# Patient Record
Sex: Female | Born: 1970 | Race: Black or African American | Hispanic: No | Marital: Single | Smoking: Former smoker
Health system: Southern US, Community
[De-identification: ages and names within clinical notes are randomized; demographics above are authoritative.]

## PROBLEM LIST (undated history)

## (undated) HISTORY — PX: APPENDECTOMY: SHX54

---

## 2004-12-25 ENCOUNTER — Emergency Department: Payer: Self-pay | Admitting: Emergency Medicine

## 2006-06-01 ENCOUNTER — Emergency Department: Payer: Self-pay | Admitting: Emergency Medicine

## 2007-01-30 ENCOUNTER — Emergency Department: Payer: Self-pay | Admitting: Emergency Medicine

## 2010-07-15 ENCOUNTER — Ambulatory Visit: Payer: Self-pay | Admitting: General Practice

## 2012-07-12 ENCOUNTER — Ambulatory Visit: Payer: Self-pay

## 2013-02-09 ENCOUNTER — Emergency Department: Payer: Self-pay | Admitting: Emergency Medicine

## 2014-06-17 ENCOUNTER — Emergency Department: Payer: Self-pay | Admitting: Student

## 2014-07-19 ENCOUNTER — Encounter: Payer: Self-pay | Admitting: Family Medicine

## 2014-07-22 ENCOUNTER — Encounter: Payer: Self-pay | Admitting: Family Medicine

## 2014-08-21 ENCOUNTER — Encounter: Payer: Self-pay | Admitting: Family Medicine

## 2014-09-21 ENCOUNTER — Encounter: Payer: Self-pay | Admitting: Family Medicine

## 2014-10-22 ENCOUNTER — Encounter: Payer: Self-pay | Admitting: Family Medicine

## 2016-04-06 ENCOUNTER — Ambulatory Visit: Payer: Self-pay | Attending: Internal Medicine

## 2016-07-15 ENCOUNTER — Ambulatory Visit: Payer: Self-pay | Attending: Oncology

## 2019-04-24 ENCOUNTER — Ambulatory Visit: Payer: Self-pay

## 2019-04-26 ENCOUNTER — Ambulatory Visit: Payer: Self-pay

## 2019-04-26 ENCOUNTER — Other Ambulatory Visit: Payer: Self-pay

## 2019-04-26 DIAGNOSIS — Z113 Encounter for screening for infections with a predominantly sexual mode of transmission: Secondary | ICD-10-CM

## 2019-04-26 DIAGNOSIS — A599 Trichomoniasis, unspecified: Secondary | ICD-10-CM

## 2019-04-26 MED ORDER — METRONIDAZOLE 500 MG PO TABS: 2000.0000 mg | ORAL_TABLET | Freq: Once | ORAL | 0 refills | Status: AC

## 2019-04-26 NOTE — Progress Notes (Signed)
    STI clinic/screening visit  Subjective:  Katherine Mercado is a 48 y.o. female being seen today for an STI screening visit. The patient reports they do have symptoms.  Patient has the following medical conditions:  There are no active problems to display for this patient.    Chief Complaint  Patient presents with  . SEXUALLY TRANSMITTED DISEASE    HPI  Patient reports  disch x 2 wks.  Discharge began as light yellow, small amount and over time disch has increased and now has an odor.  See flowsheet for further details and programmatic requirements.    The following portions of the patient's history were reviewed and updated as appropriate: allergies, current medications, past medical history, past social history, past surgical history and problem list.  Objective:  There were no vitals filed for this visit.  Physical Exam Constitutional:      Appearance: She is obese.  HENT:     Mouth/Throat:     Pharynx: Oropharynx is clear. No oropharyngeal exudate.  Neck:     Musculoskeletal: Neck supple. No muscular tenderness.  Abdominal:     Palpations: Abdomen is soft.  Genitourinary:    General: Normal vulva.     Vagina: Vaginal discharge present.     Comments: Mod amt of yellow disch, ph >4.5, + odor no adenopathy Lymphadenopathy:     Cervical: No cervical adenopathy.  Skin:    General: Skin is dry.     Findings: No lesion or rash.  Neurological:     Mental Status: She is alert.    Assessment and Plan:  Katherine Mercado is a 48 y.o. female presenting to the [place] for STI screening  1. Screening examination for venereal disease - WET PREP FOR [place], YEAST, CLUE - Chlamydia/Gonorrhea [place] - HIV [facility] - Syphilis Serology, [facility]  2. Trichimoniasis Metronidazole 2gm po in a single dose. Co. That partner needs treatment Co. To use condoms until partner is treated and always for STD prevention.     No follow-ups on  file.  No future appointments.  Hassell Done, FNP

## 2019-04-26 NOTE — Progress Notes (Signed)
Here today for STD screening. Accepts bloodwork. Hal Morales, RN  Wet Prep results reviewed. Patient treated for Trich per standing orders. Hal Morales, RN

## 2019-04-27 LAB — WET PREP FOR TRICH, YEAST, CLUE
Trichomonas Exam: POSITIVE — AB
Yeast Exam: NEGATIVE

## 2020-05-14 ENCOUNTER — Other Ambulatory Visit: Payer: Self-pay

## 2020-05-14 ENCOUNTER — Ambulatory Visit
Admission: RE | Admit: 2020-05-14 | Discharge: 2020-05-14 | Disposition: A | Discharge status: Home / Self Care | Payer: PRIVATE HEALTH INSURANCE | Source: Ambulatory Visit | Attending: Urgent Care | Admitting: Urgent Care

## 2020-05-14 VITALS — BP 158/97 | HR 75 | Temp 98.4°F | Resp 18

## 2020-05-14 DIAGNOSIS — R350 Frequency of micturition: Secondary | ICD-10-CM | POA: Diagnosis present

## 2020-05-14 DIAGNOSIS — N3001 Acute cystitis with hematuria: Secondary | ICD-10-CM | POA: Diagnosis not present

## 2020-05-14 DIAGNOSIS — R300 Dysuria: Secondary | ICD-10-CM | POA: Insufficient documentation

## 2020-05-14 LAB — POCT URINALYSIS DIP (MANUAL ENTRY)
Bilirubin, UA: NEGATIVE
Glucose, UA: NEGATIVE mg/dL
Ketones, POC UA: NEGATIVE mg/dL
Nitrite, UA: NEGATIVE
Protein Ur, POC: 100 mg/dL — AB
Spec Grav, UA: 1.025 (ref 1.010–1.025)
Urobilinogen, UA: 0.2 E.U./dL
pH, UA: 6 (ref 5.0–8.0)

## 2020-05-14 MED ORDER — CEPHALEXIN 500 MG PO CAPS: 500.0000 mg | ORAL_CAPSULE | Freq: Two times a day (BID) | ORAL | 0 refills | Status: DC

## 2020-05-14 NOTE — ED Provider Notes (Signed)
Katherine Mercado   MRN: [id number] DOB: April 17, 1971  Subjective:   Katherine Mercado is a 49 y.o. female presenting for several day history of urinary frequency, dysuria, cloudy malodorous urine.  Denies fever, nausea, vomiting, belly or pelvic pain, flank pain.  Patient admits that she does not drink water very well at all, likes to drink Pepsi regularly.  No current facility-administered medications for this encounter. No current outpatient medications on file.   No Known Allergies  History reviewed. No pertinent past medical history.   Past Surgical History:  Procedure Laterality Date  . APPENDECTOMY      No family history on file.  Social History   Tobacco Use  . Smoking status: Former Smoker    Quit date: 04/25/2000    Years since quitting: 20.0  . Smokeless tobacco: Never Used  Vaping Use  . Vaping Use: Never used  Substance Use Topics  . Alcohol use: Yes    Comment: socialy  . Drug use: Never    ROS   Objective:   Vitals: BP (!) 158/97 (BP Location: Right Arm)   Pulse 75   Temp 98.4 F (36.9 C) (Oral)   Resp 18   SpO2 100%   Physical Exam Constitutional:      General: She is not in acute distress.    Appearance: Normal appearance. She is well-developed. She is not ill-appearing, toxic-appearing or diaphoretic.  HENT:     Head: Normocephalic and atraumatic.     Nose: Nose normal.     Mouth/Throat:     Mouth: Mucous membranes are moist.     Pharynx: Oropharynx is clear.  Eyes:     General: No scleral icterus.       Right eye: No discharge.        Left eye: No discharge.     Extraocular Movements: Extraocular movements intact.     Conjunctiva/sclera: Conjunctivae normal.     Pupils: Pupils are equal, round, and reactive to light.  Cardiovascular:     Rate and Rhythm: Normal rate.  Pulmonary:     Effort: Pulmonary effort is normal.  Abdominal:     General: Bowel sounds are normal. There is no distension.     Palpations: Abdomen is soft.  There is no mass.     Tenderness: There is no abdominal tenderness. There is no right CVA tenderness, left CVA tenderness, guarding or rebound.  Skin:    General: Skin is warm and dry.  Neurological:     General: No focal deficit present.     Mental Status: She is alert and oriented to person, place, and time.  Psychiatric:        Mood and Affect: Mood normal.        Behavior: Behavior normal.        Thought Content: Thought content normal.        Judgment: Judgment normal.     Results for orders placed or performed during the hospital encounter of 05/14/20 (from the past 24 hour(s))  POCT urinalysis dipstick     Status: Abnormal   Collection Time: 05/14/20  2:37 PM  Result Value Ref Range   Color, UA straw (A) yellow   Clarity, UA cloudy (A) clear   Glucose, UA negative negative mg/dL   Bilirubin, UA negative negative   Ketones, POC UA negative negative mg/dL   Spec Grav, UA 6.010 9.323 - 1.025   Blood, UA moderate (A) negative   pH, UA 6.0 5.0 - 8.0  Protein Ur, POC =100 (A) negative mg/dL   Urobilinogen, UA 0.2 0.2 or 1.0 E.U./dL   Nitrite, UA Negative Negative   Leukocytes, UA Moderate (2+) (A) Negative    Assessment and Plan :   PDMP not reviewed this encounter.  1. Acute cystitis with hematuria   2. Dysuria   3. Urinary frequency     Start Keflex, urine culture pending.  Recommended maintaining regular hydration and avoiding urinary irritants. Counseled patient on potential for adverse effects with medications prescribed/recommended today, ER and return-to-clinic precautions discussed, patient verbalized understanding.    Wallis Bamberg, [place] 05/14/20 1449

## 2020-05-14 NOTE — Discharge Instructions (Signed)
Make sure you hydrate very well with plain water and a quantity of 64 ounces of water a day.  Please limit drinks that are considered urinary irritants such as soda, sweet tea, coffee, energy drinks, alcohol.  These can worsen your UTI symptoms and also be the source of them.  I will let you know about your urine culture results through MyChart to see if we need to change your antibiotics based off of those results. ° °

## 2020-05-14 NOTE — ED Triage Notes (Signed)
Pt reports to UC with urinary frequency, dysuria, malodorous.  No hematuria, no abdominal pain.  No concern for STDs.

## 2020-05-17 LAB — URINE CULTURE: Culture: 70000 — AB

## 2020-11-14 ENCOUNTER — Emergency Department: Payer: HRSA Program

## 2020-11-14 ENCOUNTER — Emergency Department
Admission: EM | Admit: 2020-11-14 | Discharge: 2020-11-14 | Disposition: A | Discharge status: Home / Self Care | Payer: HRSA Program | Attending: Emergency Medicine | Admitting: Emergency Medicine

## 2020-11-14 ENCOUNTER — Encounter: Payer: Self-pay | Admitting: *Deleted

## 2020-11-14 ENCOUNTER — Other Ambulatory Visit: Payer: Self-pay

## 2020-11-14 DIAGNOSIS — U071 COVID-19: Secondary | ICD-10-CM | POA: Diagnosis not present

## 2020-11-14 DIAGNOSIS — Z87891 Personal history of nicotine dependence: Secondary | ICD-10-CM | POA: Diagnosis not present

## 2020-11-14 DIAGNOSIS — R519 Headache, unspecified: Secondary | ICD-10-CM | POA: Diagnosis present

## 2020-11-14 MED ORDER — DIPHENHYDRAMINE HCL 50 MG/ML IJ SOLN
50.0000 mg | Freq: Once | INTRAMUSCULAR | Status: AC
  Administered 2020-11-14: 50 mg via INTRAMUSCULAR
  Filled 2020-11-14: qty 1

## 2020-11-14 MED ORDER — PROMETHAZINE HCL 25 MG/ML IJ SOLN
25.0000 mg | Freq: Once | INTRAMUSCULAR | Status: AC
  Administered 2020-11-14: 25 mg via INTRAMUSCULAR
  Filled 2020-11-14: qty 1

## 2020-11-14 MED ORDER — KETOROLAC TROMETHAMINE 30 MG/ML IJ SOLN
30.0000 mg | Freq: Once | INTRAMUSCULAR | Status: AC
  Administered 2020-11-14: 30 mg via INTRAMUSCULAR
  Filled 2020-11-14: qty 1

## 2020-11-14 NOTE — ED Triage Notes (Signed)
First Nurse Note:  C/O headache, body aches, chills since Monday.  AAOx3.  Skin warm and dry. NAD

## 2020-11-14 NOTE — ED Notes (Signed)
SEE TRIAGE NOTE. PT C/O HEADACHE X4 DAYS. THINKS SHE HAS COVID OR "SOMETHING".

## 2020-11-14 NOTE — ED Triage Notes (Signed)
Pt ambulatory to triage.  Pt has a headache for 4 days.  Pt has nausea.  Taking otc meds without relief.  Pt alert  Speech clear.

## 2020-11-14 NOTE — ED Provider Notes (Signed)
[facility] Emergency Department Provider Note  ____________________________________________  Time seen: Approximately 7:03 PM  I have reviewed the triage vital signs and the nursing notes.   HISTORY  Chief Complaint Headache    HPI Katherine Mercado is a 50 y.o. female who presents the emergency department complaining of onset of the worst headache of her life.  This was not described as a thunderclap but she is experience headache x3 days.  She has no history of migraines or chronic headaches.  She states that if she ever has a headache it typically resolves very quickly with Tylenol or Motrin.  She is been taking both with no relief of the headache.  They are described as a frontal/temporal headache.  Equal in intensity on both sides.  She has had no fevers, chills, nasal congestion, sore throat, cough.  No recent sick contacts.  Patient denies any other complaints at this time other than significant headache.         No past medical history on file.  There are no problems to display for this patient.   Past Surgical History:  Procedure Laterality Date  . APPENDECTOMY      Prior to Admission medications   Medication Sig Start Date End Date Taking? Authorizing Provider  cephALEXin (KEFLEX) 500 MG capsule Take 1 capsule (500 mg total) by mouth 2 (two) times daily. 05/14/20   Wallis Bamberg, PA-C    Allergies Patient has no known allergies.  No family history on file.  Social History Social History   Tobacco Use  . Smoking status: Former Smoker    Quit date: 04/25/2000    Years since quitting: 20.5  . Smokeless tobacco: Never Used  Vaping Use  . Vaping Use: Never used  Substance Use Topics  . Alcohol use: Not Currently    Comment: socialy  . Drug use: Never     Review of Systems  Constitutional: No fever/chills Eyes: No visual changes. No discharge ENT: No upper respiratory complaints. Cardiovascular: no chest pain. Respiratory: no cough.  No SOB. Gastrointestinal: No abdominal pain.  No nausea, no vomiting.  No diarrhea.  No constipation. Musculoskeletal: Negative for musculoskeletal pain. Skin: Negative for rash, abrasions, lacerations, ecchymosis. Neurological: Positive for frontal headache.  Denies focal weakness or numbness.  10 System ROS otherwise negative.  ____________________________________________   PHYSICAL EXAM:  VITAL SIGNS: ED Triage Vitals  Enc Vitals Group     BP 11/14/20 1821 129/90     Pulse Rate 11/14/20 1821 85     Resp 11/14/20 1821 18     Temp 11/14/20 1821 98.3 F (36.8 C)     Temp Source 11/14/20 1821 Oral     SpO2 11/14/20 1821 100 %     Weight 11/14/20 1819 180 lb (81.6 kg)     Height 11/14/20 1819 4\' 11"  (1.499 m)     Head Circumference --      Peak Flow --      Pain Score 11/14/20 1819 10     Pain Loc --      Pain Edu? --      Excl. in GC? --      Constitutional: Alert and oriented. Well appearing and in no acute distress. Eyes: Conjunctivae are normal. PERRL. EOMI. Head: Atraumatic.  No visible erythema or edema.  No palpable cords in the frontal regions.  No tenderness to percussion over the frontal sinuses. ENT:      Ears:       Nose: No  congestion/rhinnorhea.      Mouth/Throat: Mucous membranes are moist.  Neck: No stridor.  No cervical spine tenderness to palpation. Hematological/Lymphatic/Immunilogical: No cervical lymphadenopathy. Cardiovascular: Normal rate, regular rhythm. Normal S1 and S2.  Good peripheral circulation. Respiratory: Normal respiratory effort without tachypnea or retractions. Lungs CTAB. Good air entry to the bases with no decreased or absent breath sounds. Musculoskeletal: Full range of motion to all extremities. No gross deformities appreciated. Neurologic:  Normal speech and language. No gross focal neurologic deficits are appreciated.  Skin:   Cranial nerves II through XII grossly intact.  Negative Romberg's and pronator drift.Skin is warm, dry  and intact. No rash noted. Psychiatric: Mood and affect are normal. Speech and behavior are normal. Patient exhibits appropriate insight and judgement.   ____________________________________________   LABS (all labs ordered are listed, but only abnormal results are displayed)  Labs Reviewed  SARS CORONAVIRUS 2 (TAT 6-24 HRS)   ____________________________________________  EKG   ____________________________________________  RADIOLOGY I personally viewed and evaluated these images as part of my medical decision making, as well as reviewing the written report by the radiologist.  ED Provider Interpretation: No acute hemorrhage or mass on CT  CT Head Wo Contrast  Result Date: 11/14/2020 CLINICAL DATA:  Headache, new or worsening. New onset of worst headache of life. Not described as thunderclap. No history of headaches. Additional history provided: Patient reports headache, body aches, chills since Monday. EXAM: CT HEAD WITHOUT CONTRAST TECHNIQUE: Contiguous axial images were obtained from the base of the skull through the vertex without intravenous contrast. COMPARISON:  No pertinent prior exams available for comparison. FINDINGS: Brain: Cerebral volume is normal. There is no acute intracranial hemorrhage. No demarcated cortical infarct. No extra-axial fluid collection. No evidence of intracranial mass. No midline shift. Partially empty sella turcica. Vascular: No hyperdense vessel. Skull: Normal. Negative for fracture or focal lesion. Sinuses/Orbits: Visualized orbits show no acute finding. No significant paranasal sinus disease at the imaged levels. IMPRESSION: No evidence of acute intracranial abnormality. Partially empty sella turcica. This finding is very commonly incidental, but can be associated with idiopathic intracranial hypertension. Otherwise unremarkable non-contrast CT appearance of the brain. Electronically Signed   By: Jackey Loge DO   On: 11/14/2020 19:28     ____________________________________________    PROCEDURES  Procedure(s) performed:    Procedures    Medications  ketorolac (TORADOL) 30 MG/ML injection 30 mg (has no administration in time range)  promethazine (PHENERGAN) injection 25 mg (has no administration in time range)  diphenhydrAMINE (BENADRYL) injection 50 mg (has no administration in time range)     ____________________________________________   INITIAL IMPRESSION / ASSESSMENT AND PLAN / ED COURSE  Pertinent labs & imaging results that were available during my care of the patient were reviewed by me and considered in my medical decision making (see chart for details).  Review of the [place] CSRS was performed in accordance of the [facility] prior to dispensing any controlled drugs.           Patient's diagnosis is consistent with headache.  Patient presented to emergency department complaining of headache x3 days.  No history of headache syndromes.  Patient with bilateral temporal headache.  Neurologically intact.  Given the fact that this was the worst headache of her life with no headache history, imaging was obtained with CT scan.  There is no acute findings on CT.  At this time patient remains neurologically intact and no indication for further imaging or work-up.  Patient will be given  migraine cocktail.  Return precautions discussed with the patient.  Patient had a Covid swab performed as well which is not resulted at this time.  Patient will check MyChart call back to the emergency department tomorrow for those results.  Patient is given ED precautions to return to the ED for any worsening or new symptoms.     ____________________________________________  FINAL CLINICAL IMPRESSION(S) / ED DIAGNOSES  Final diagnoses:  Acute intractable headache, unspecified headache type      NEW MEDICATIONS STARTED DURING THIS VISIT:  ED Discharge Orders    None          This chart was dictated using voice  recognition software/Dragon. Despite best efforts to proofread, errors can occur which can change the meaning. Any change was purely unintentional.    Lanette Hampshire 11/14/20 2034    Shaune Pollack, MD 11/18/20 2117

## 2020-11-15 LAB — SARS CORONAVIRUS 2 (TAT 6-24 HRS): SARS Coronavirus 2: POSITIVE — AB

## 2022-08-04 IMAGING — CT CT HEAD W/O CM
3 series · 15 of 43 positions shown, 18 images · non-contrast
Comparison: No pertinent prior exams available for comparison.

CLINICAL DATA: Headache, new or worsening. New onset of worst
headache of life. Not described as thunderclap. No history of
headaches. Additional history provided: Patient reports headache,
body aches, chills since [REDACTED].

EXAM:
CT HEAD WITHOUT CONTRAST
TECHNIQUE: Contiguous axial images were obtained from the base of the skull
through the vertex without intravenous contrast.

[Series 2: head wo · axial · 0.39mm/px · z∈[+326,+431]mm · 9 of 26 slices shown, 12 images]
[im 3/26  brain]
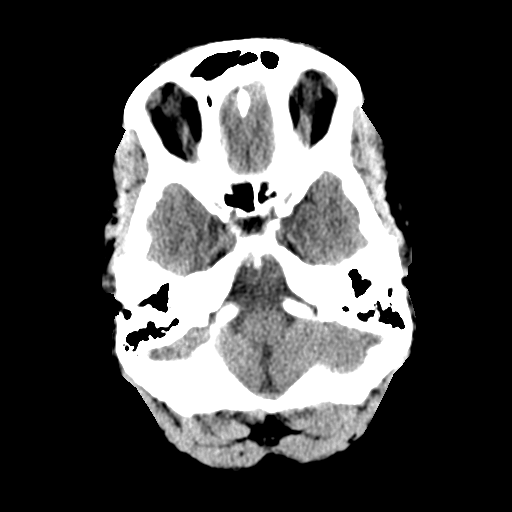
[im 3/26  bone]
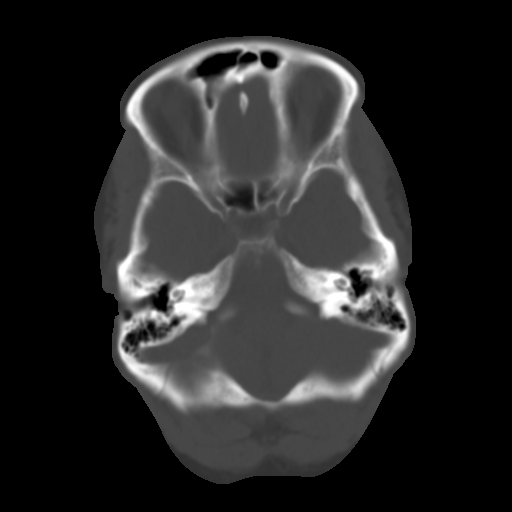
[im 6/26  brain]
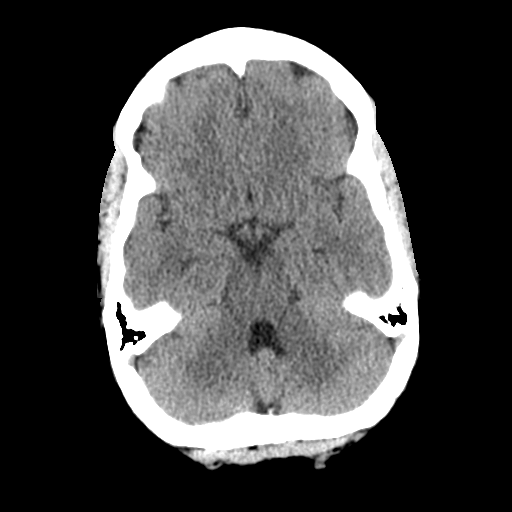
[im 8/26  brain]
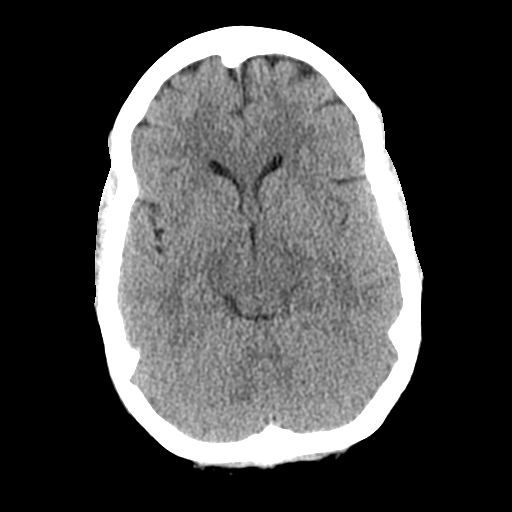
[im 11/26  brain]
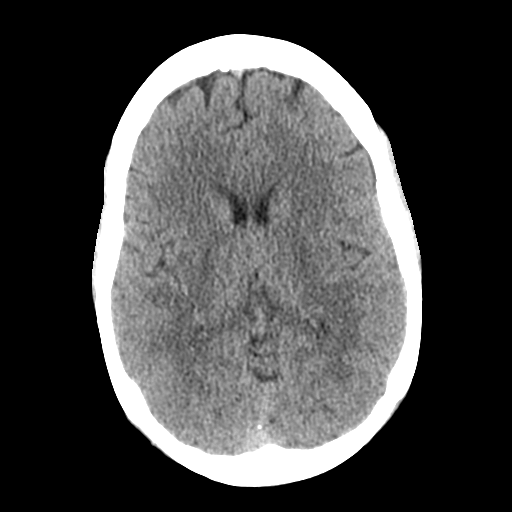
[im 14/26  brain]
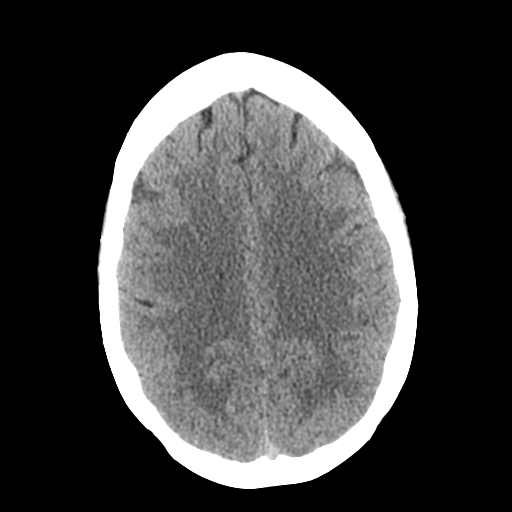
[im 14/26  bone]
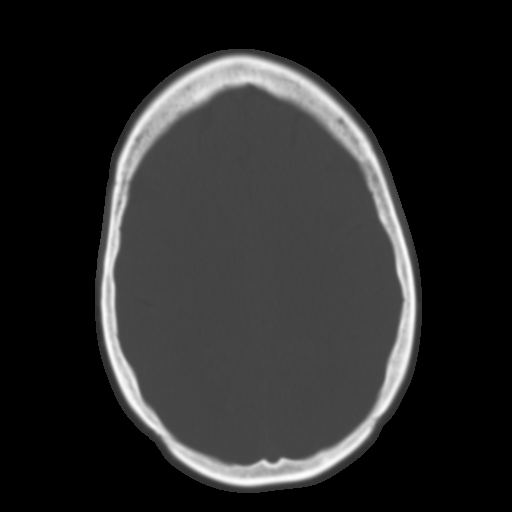
[im 16/26  brain]
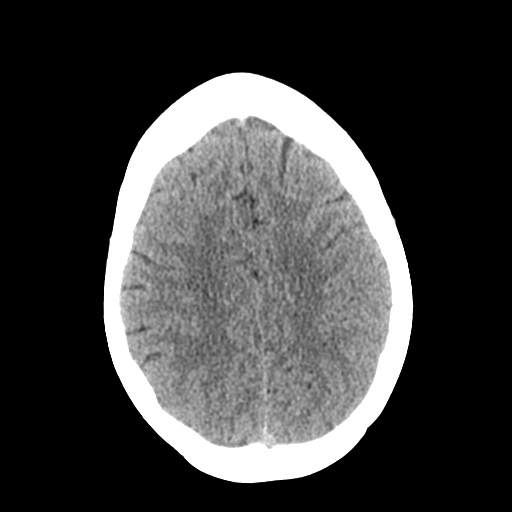
[im 19/26  brain]
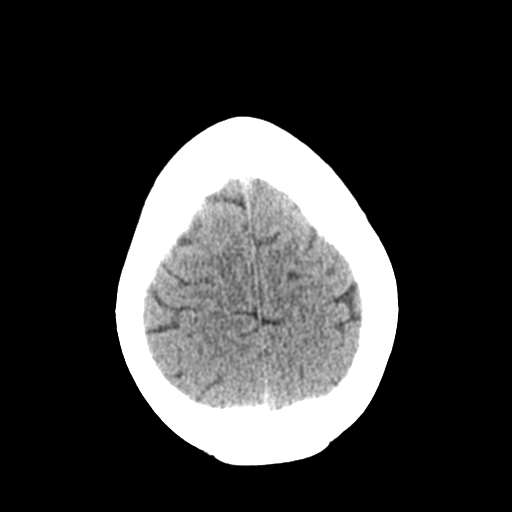
[im 22/26  brain]
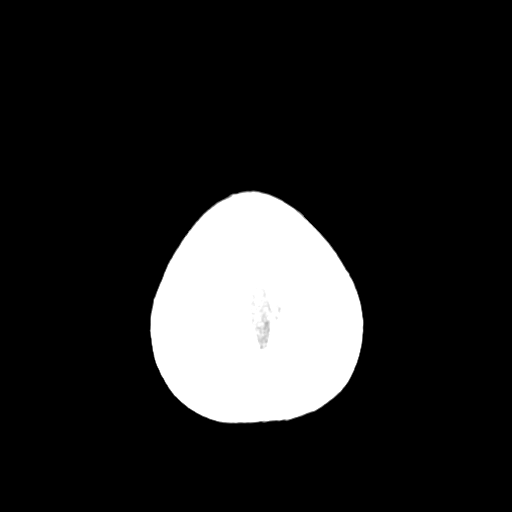
[im 24/26  brain]
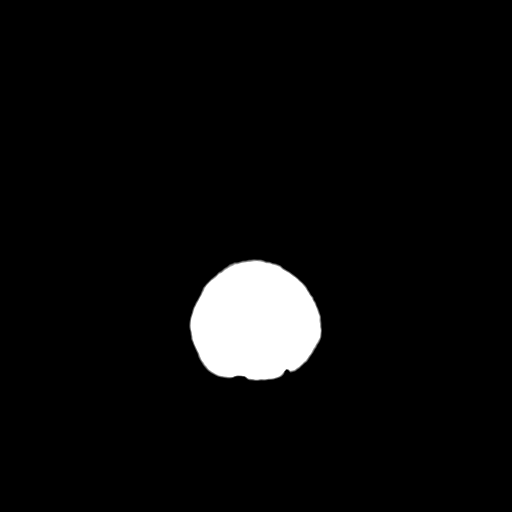
[im 24/26  bone]
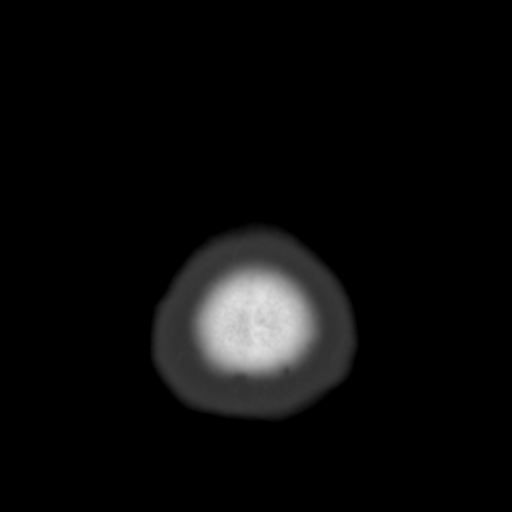

[Series 4: coronal soft tissue · coronal · 0.27mm/px · 3 of 64 slices shown]
[im 22/64  brain]
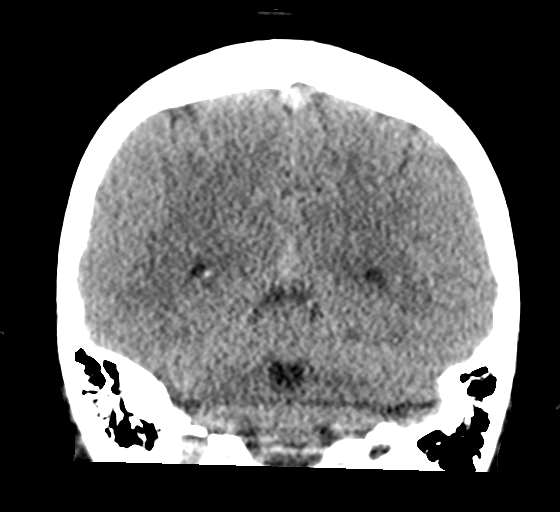
[im 29/64  brain]
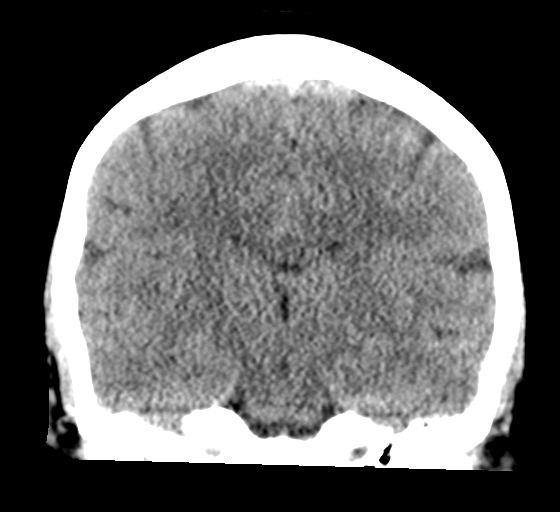
[im 36/64  brain]
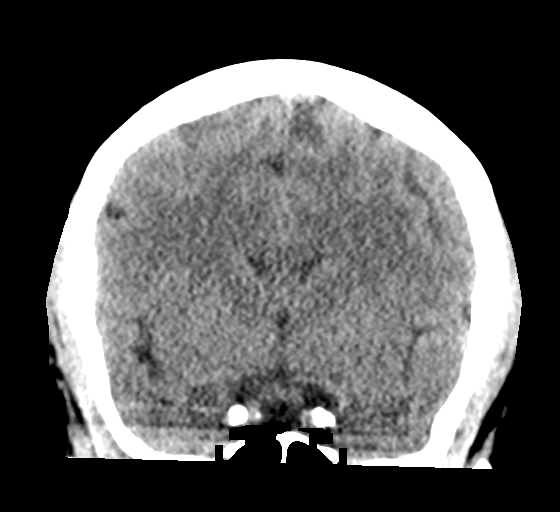

[Series 5: sagittal soft tissue · sagittal · 0.27mm/px · 3 of 48 slices shown]
[im 16/48  brain]
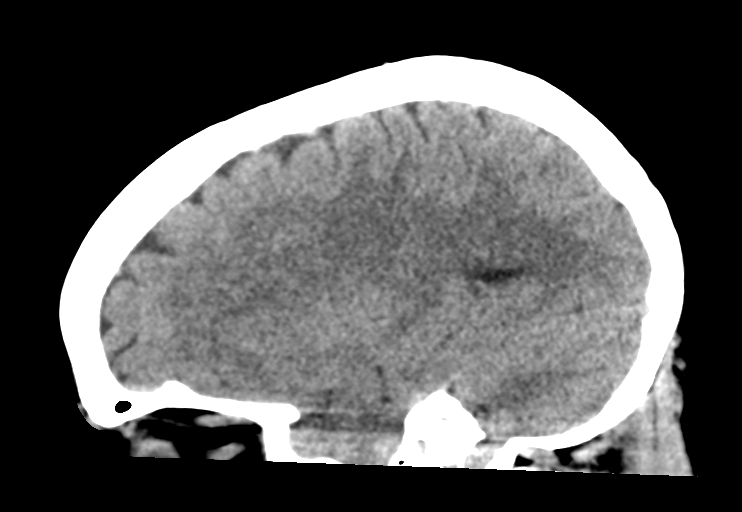
[im 24/48  brain]
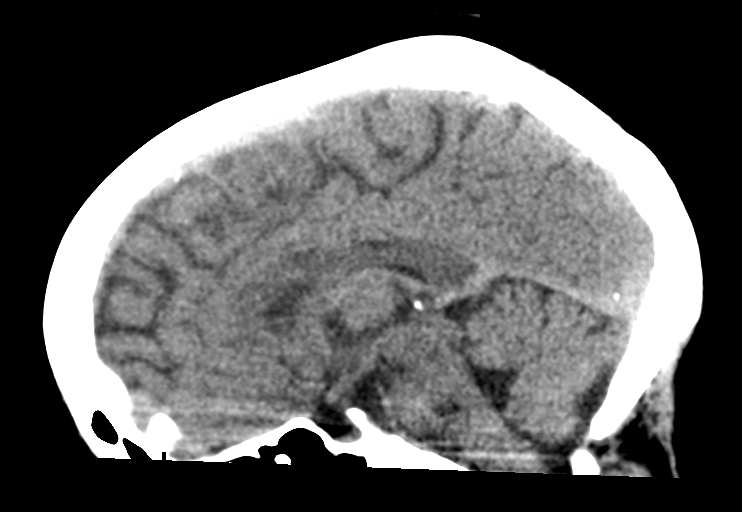
[im 32/48  brain]
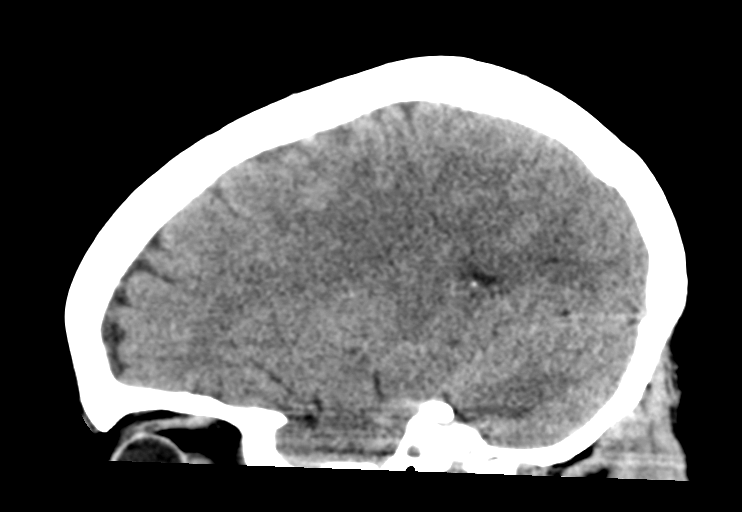

[15 of 43 positions shown; findings below may reference images not displayed]

FINDINGS: Brain:

Cerebral volume is normal.

There is no acute intracranial hemorrhage.

No demarcated cortical infarct.

No extra-axial fluid collection.

No evidence of intracranial mass.

No midline shift.

Partially empty sella turcica.

Vascular: No hyperdense vessel.

Skull: Normal. Negative for fracture or focal lesion.

Sinuses/Orbits: Visualized orbits show no acute finding. No
significant paranasal sinus disease at the imaged levels.
IMPRESSION: No evidence of acute intracranial abnormality.

Partially empty sella turcica. This finding is very commonly
incidental, but can be associated with idiopathic intracranial
hypertension.

Otherwise unremarkable non-contrast CT appearance of the brain.

## 2022-08-12 ENCOUNTER — Encounter: Payer: Self-pay | Admitting: *Deleted

## 2022-08-12 ENCOUNTER — Emergency Department
Admission: EM | Admit: 2022-08-12 | Discharge: 2022-08-12 | Disposition: A | Discharge status: Home / Self Care | Payer: Self-pay | Attending: Student in an Organized Health Care Education/Training Program | Admitting: Student in an Organized Health Care Education/Training Program

## 2022-08-12 ENCOUNTER — Other Ambulatory Visit: Payer: Self-pay

## 2022-08-12 DIAGNOSIS — R519 Headache, unspecified: Secondary | ICD-10-CM | POA: Insufficient documentation

## 2022-08-12 DIAGNOSIS — J3489 Other specified disorders of nose and nasal sinuses: Secondary | ICD-10-CM | POA: Insufficient documentation

## 2022-08-12 MED ORDER — AMOXICILLIN-POT CLAVULANATE 875-125 MG PO TABS: 1.0000 | ORAL_TABLET | Freq: Two times a day (BID) | ORAL | 0 refills | Status: AC

## 2022-08-12 NOTE — Discharge Instructions (Signed)
-  Please take the full course of the antibiotics as prescribed.  This will treat any potential sinus infection.  -Please schedule appoint with the ENT provider listed in these instructions.  Let them know that you were seen here in the emergency department.  -Return to the emergency department anytime if you begin to experience any new or worsening symptoms.

## 2022-08-12 NOTE — ED Triage Notes (Signed)
Pt has nasal drainage for 2 months.  States it constantly drains.  Pt reports a headache.  Pt alert  speech clear.

## 2022-08-12 NOTE — ED Provider Notes (Signed)
[facility] Provider Note    Event Date/Time   First MD Initiated Contact with Patient 08/12/22 2130     (approximate)   History   Chief Complaint Nasal Congestion   HPI Katherine Mercado is a 51 y.o. female, no significant medical history, presents to the emergency department for evaluation of nasal drainage.  Patient states that she has been having unilateral nasal drainage for the past 2 months from the left nare.  She states that she does not feel sick and does not feel particularly congested, but continues to have persistent watery drainage.  She states that she has a mild headache, otherwise no significant symptoms.  Denies fever/chills, chest pain, shortness of breath, dysphagia, abdominal pain, flank pain, nausea/vomiting, diarrhea, epistaxis, dysuria, vision changes, weakness, or hearing changes.  History Limitations: No limitations.        Physical Exam  Triage Vital Signs: ED Triage Vitals  Enc Vitals Group     BP 08/12/22 2027 (!) 157/94     Pulse Rate 08/12/22 2027 78     Resp 08/12/22 2027 18     Temp 08/12/22 2027 98.6 F (37 C)     Temp Source 08/12/22 2027 Oral     SpO2 08/12/22 2027 98 %     Weight 08/12/22 2024 178 lb 9.2 oz (81 kg)     Height 08/12/22 2024 4\' 11"  (1.499 m)     Head Circumference --      Peak Flow --      Pain Score 08/12/22 2024 0     Pain Loc --      Pain Edu? --      Excl. in GC? --     Most recent vital signs: Vitals:   08/12/22 2027  BP: (!) 157/94  Pulse: 78  Resp: 18  Temp: 98.6 F (37 C)  SpO2: 98%    General: Awake, NAD.  Skin: Warm, dry. No rashes or lesions.  Eyes: PERRL. Conjunctivae normal.  CV: Good peripheral perfusion.  Resp: Normal effort.  Abd: Soft, non-tender. No distention.  Neuro: At baseline. No gross neurological deficits.  Musculoskeletal: Normal ROM of all extremities.  Focused Exam: Nasal examination unremarkable.  There does appear to be some intermittent watery  rhinorrhea coming from the left nare.   Physical Exam    ED Results / Procedures / Treatments  Labs (all labs ordered are listed, but only abnormal results are displayed) Labs Reviewed - No data to display   EKG N/A.    RADIOLOGY  ED Provider Interpretation: N/A.  No results found.  PROCEDURES:  Critical Care performed: N/A.  Procedures    MEDICATIONS ORDERED IN ED: Medications - No data to display   IMPRESSION / MDM / ASSESSMENT AND PLAN / ED COURSE  I reviewed the triage vital signs and the nursing notes.                              Differential diagnosis includes, but is not limited to, cluster headache, sinusitis, allergic rhinitis, viral URI  Assessment/Plan Patient presents with watery, unilateral rhinorrhea x2 months.  Nasal examination unremarkable.  She appears well clinically.  She denies any recent falls, injuries, or head trauma.  Unclear of the etiology at this time, however given the persistent unilateral nature of the symptoms, we will trial a course of Augmentin to see if this helps improve her symptoms.  Could also potentially be  related to the headaches that she has had in the past, perhaps a cluster headache.  However, I did advise her to follow-up with ENT for further evaluation.  Patient was amenable to this plan.  Will discharge.  Provided the patient with anticipatory guidance, return precautions, and educational material. Encouraged the patient to return to the emergency department at any time if they begin to experience any new or worsening symptoms. Patient expressed understanding and agreed with the plan.   Patient's presentation is most consistent with acute, uncomplicated illness.       FINAL CLINICAL IMPRESSION(S) / ED DIAGNOSES   Final diagnoses:  None     Rx / DC Orders   ED Discharge Orders          Ordered    amoxicillin-clavulanate (AUGMENTIN) 875-125 MG tablet  2 times daily        08/12/22 2208              Note:  This document was prepared using Dragon voice recognition software and may include unintentional dictation errors.   Varney Daily, [place] 08/12/22 2214    Willy Eddy, MD 08/12/22 [phone]

## 2022-09-03 ENCOUNTER — Emergency Department: Payer: PRIVATE HEALTH INSURANCE

## 2022-09-03 ENCOUNTER — Other Ambulatory Visit: Payer: Self-pay

## 2022-09-03 ENCOUNTER — Encounter: Payer: Self-pay | Admitting: Emergency Medicine

## 2022-09-03 ENCOUNTER — Emergency Department
Admission: EM | Admit: 2022-09-03 | Discharge: 2022-09-03 | Disposition: A | Discharge status: Home / Self Care | Payer: PRIVATE HEALTH INSURANCE | Attending: Emergency Medicine | Admitting: Emergency Medicine

## 2022-09-03 DIAGNOSIS — R000 Tachycardia, unspecified: Secondary | ICD-10-CM | POA: Diagnosis not present

## 2022-09-03 DIAGNOSIS — J101 Influenza due to other identified influenza virus with other respiratory manifestations: Secondary | ICD-10-CM | POA: Diagnosis not present

## 2022-09-03 DIAGNOSIS — I10 Essential (primary) hypertension: Secondary | ICD-10-CM | POA: Diagnosis not present

## 2022-09-03 DIAGNOSIS — Z20822 Contact with and (suspected) exposure to covid-19: Secondary | ICD-10-CM | POA: Diagnosis not present

## 2022-09-03 DIAGNOSIS — R0981 Nasal congestion: Secondary | ICD-10-CM | POA: Diagnosis present

## 2022-09-03 LAB — RESP PANEL BY RT-PCR (RSV, FLU A&B, COVID)  RVPGX2
Influenza A by PCR: POSITIVE — AB
Influenza B by PCR: NEGATIVE
Resp Syncytial Virus by PCR: NEGATIVE
SARS Coronavirus 2 by RT PCR: NEGATIVE

## 2022-09-03 MED ORDER — ACETAMINOPHEN 500 MG PO TABS
1000.0000 mg | ORAL_TABLET | Freq: Once | ORAL | Status: AC
  Administered 2022-09-03: 1000 mg via ORAL
  Filled 2022-09-03: qty 2

## 2022-09-03 MED ORDER — ONDANSETRON HCL 4 MG PO TABS: 4.0000 mg | ORAL_TABLET | Freq: Every day | ORAL | 1 refills | Status: AC | PRN

## 2022-09-03 MED ORDER — ACETAMINOPHEN 325 MG PO TABS: 650.0000 mg | ORAL_TABLET | Freq: Four times a day (QID) | ORAL | 2 refills | Status: AC | PRN

## 2022-09-03 MED ORDER — ACETAMINOPHEN-CODEINE 300-15 MG PO TABS: 1.0000 | ORAL_TABLET | Freq: Every evening | ORAL | 0 refills | Status: AC | PRN

## 2022-09-03 MED ORDER — ONDANSETRON 4 MG PO TBDP
4.0000 mg | ORAL_TABLET | Freq: Once | ORAL | Status: AC | PRN
  Administered 2022-09-03: 4 mg via ORAL
  Filled 2022-09-03: qty 1

## 2022-09-03 MED ORDER — IBUPROFEN 200 MG PO TABS: 400.0000 mg | ORAL_TABLET | Freq: Four times a day (QID) | ORAL | 2 refills | Status: AC | PRN

## 2022-09-03 MED ORDER — OSELTAMIVIR PHOSPHATE 75 MG PO CAPS: 75.0000 mg | ORAL_CAPSULE | Freq: Two times a day (BID) | ORAL | 0 refills | Status: AC

## 2022-09-03 MED ORDER — IBUPROFEN 600 MG PO TABS
600.0000 mg | ORAL_TABLET | Freq: Once | ORAL | Status: AC
  Administered 2022-09-03: 600 mg via ORAL
  Filled 2022-09-03: qty 1

## 2022-09-03 NOTE — ED Triage Notes (Incomplete)
Pt presents via POV with complaints of nasal congestion with associated headache that started last night. Pt has taken OTC cold medication with no improvement to her sx. Denies CP or SOB.

## 2022-09-03 NOTE — ED Provider Notes (Addendum)
[facility] Provider Note    Event Date/Time   First MD Initiated Contact with Patient 09/03/22 [phone]     (approximate)   History   Nasal Congestion   HPI  Katherine Mercado is a 51 y.o. female   Past medical history of no significant past medical history presents to the emergency department with 2 days of cough, nasal congestion, fever, headache and myalgia.  No known sick contacts.  Has had poor p.o. intake due to the poor appetite.  Mild nausea but no vomiting.  No abdominal pain.  No urinary symptoms.  No other acute medical complaints.    Not vaccinated for influenza.  History was obtained via the patient.      Physical Exam   Triage Vital Signs: ED Triage Vitals  Enc Vitals Group     BP 09/03/22 0246 (!) 140/75     Pulse Rate 09/03/22 0246 (!) 116     Resp 09/03/22 0246 18     Temp 09/03/22 0246 (!) 102.2 F (39 C)     Temp Source 09/03/22 0246 Oral     SpO2 09/03/22 0246 96 %     Weight 09/03/22 0247 180 lb (81.6 kg)     Height 09/03/22 0247 4\' 11"  (1.499 m)     Head Circumference --      Peak Flow --      Pain Score --      Pain Loc --      Pain Edu? --      Excl. in GC? --     Most recent vital signs: Vitals:   09/03/22 0246  BP: (!) 140/75  Pulse: (!) 116  Resp: 18  Temp: (!) 102.2 F (39 C)  SpO2: 96%    General: Awake, no distress.  CV:  Good peripheral perfusion.  Resp:  Normal effort.  Abd:  No distention.  Other:  Is dry cough.  Tachycardic 116.  Mildly hypertensive.  Febrile 102.  No hypoxemia.  Nontoxic-appearing, comfortable and conversant awake alert and pleasant.  Abdomen soft and nontender to palpation.  Lungs without wheezing or focalities.  Appears euvolemic   ED Results / Procedures / Treatments   Labs (all labs ordered are listed, but only abnormal results are displayed) Labs Reviewed  RESP PANEL BY RT-PCR (RSV, FLU A&B, COVID)  [id number] - Abnormal; Notable for the following components:      Result  Value   Influenza A by PCR POSITIVE (*)    All other components within normal limits     I reviewed labs and they are notable for influenza A positive   I independently reviewed and interpreted chest x-ray and see no obvious focalities or pneumothorax  PROCEDURES:  Critical Care performed: No  Procedures   MEDICATIONS ORDERED IN ED: Medications  ibuprofen (ADVIL) tablet 600 mg (has no administration in time range)  ondansetron (ZOFRAN-ODT) disintegrating tablet 4 mg (has no administration in time range)  acetaminophen (TYLENOL) tablet 1,000 mg (1,000 mg Oral Given 09/03/22 0249)     IMPRESSION / MDM / ASSESSMENT AND PLAN / ED COURSE  I reviewed the triage vital signs and the nursing notes.                              Differential diagnosis includes, but is not limited to, viral upper respiratory tract infection, bacterial pneumonia, sinusitis, sepsis    MDM: Overall well-appearing patient with flulike  illness symptoms for less than 2 days with influenza A positive.  She is not flu vaccinated.  She is febrile and tachycardic.  Will give antipyretics.  She has bothersome cough but a benign pulmonary exam to auscultation so I doubt bacterial pneumonia or exacerbation of asthma.  She looks well doubt sepsis or more emergent bacterial infections.  Will give antipyretics, p.o. intake, recheck vital signs --- patient comfortable on recheck heart rate is now 96, afebrile, normotensive.  Oxygen saturation remains good at 96% on room air.  Breathing comfortably.  We discussed risks and benefits of Tamiflu and she opts for prescription of this medication.  Anticipatory guidance, she will follow-up with primary doctor.  Return if worsening of symptoms.   Patient's presentation is most consistent with acute presentation with potential threat to life or bodily function.       FINAL CLINICAL IMPRESSION(S) / ED DIAGNOSES   Final diagnoses:  Influenza A     Rx / DC Orders    ED Discharge Orders          Ordered    oseltamivir (TAMIFLU) 75 MG capsule  2 times daily        09/03/22 0411    acetaminophen-codeine (TYLENOL #2) 300-15 MG tablet  At bedtime PRN        09/03/22 0411    ibuprofen (MOTRIN IB) 200 MG tablet  Every 6 hours PRN        09/03/22 0411    acetaminophen (TYLENOL) 325 MG tablet  Every 6 hours PRN        09/03/22 0411    ondansetron (ZOFRAN) 4 MG tablet  Daily PRN        09/03/22 0417             Note:  This document was prepared using Dragon voice recognition software and may include unintentional dictation errors.    Pilar Jarvis, MD 09/03/22 [id number]    Pilar Jarvis, MD 09/03/22 [id number]    Pilar Jarvis, MD 09/03/22 [phone]

## 2022-09-03 NOTE — Discharge Instructions (Addendum)
Take Tamiflu as prescribed for 5 days. Take acetaminophen 650 mg and ibuprofen 400 mg every 6 hours for pain.  Take with food. Take Zofran as needed for nausea. Stay well-hydrated by drinking plenty of fluids.  Thank you for choosing [place] for your health care today!  Please see your primary doctor this week for a follow up appointment.   Sometimes, in the early stages of certain disease courses it is difficult to detect in the emergency department evaluation -- so, it is important that you continue to monitor your symptoms and call your doctor right away or return to the emergency department if you develop any new or worsening symptoms.  Please go to the following website to schedule new (and existing) patient appointments:   [email or web address]  If you do not have a primary doctor try calling the following clinics to establish care:  If you have insurance:  [facility] [phone] [address] [place] [place] [address]   [facility]  [phone] [address] [place] [place] [address]   If you do not have insurance:  [facility]  [phone] [address] [place] [address]   The following is another list of primary care offices in the area who are accepting new patients at this time.  Please reach out to one of them directly and let them know you would like to schedule an appointment to follow up on an Emergency Department visit, and/or to establish a new primary care provider (PCP).  There are likely other primary care clinics in the are who are accepting new patients, but this is an excellent place to start:  [facility] Lead physician: Dr Shirlee Latch [address] [place], [place] [address] [phone]  [facility] Lead Physician: Dr Alba Cory [address], [place], [place] [address] [phone]  [facility]  Lead  Physician: Dr Olevia Perches [address] [place], [place] [address] [phone]  [facility] Lead Physician: Dr Sofie Hartigan [address] [place], [place], [place] [address] [phone]  [facility] Lead Physician: Dr Bari Edward [address] [place], [place] [address] [phone]   It was my pleasure to care for you today.   Daneil Dan Modesto Charon, MD

## 2022-12-14 ENCOUNTER — Ambulatory Visit
Admission: RE | Admit: 2022-12-14 | Discharge: 2022-12-14 | Disposition: A | Discharge status: Home / Self Care | Payer: PRIVATE HEALTH INSURANCE | Attending: Chiropractor | Admitting: Chiropractor

## 2022-12-14 ENCOUNTER — Ambulatory Visit
Admission: RE | Admit: 2022-12-14 | Discharge: 2022-12-14 | Disposition: A | Discharge status: Home / Self Care | Payer: PRIVATE HEALTH INSURANCE | Source: Ambulatory Visit | Attending: Chiropractor | Admitting: Chiropractor

## 2022-12-14 ENCOUNTER — Other Ambulatory Visit: Payer: Self-pay | Admitting: Chiropractor

## 2022-12-14 DIAGNOSIS — S39012A Strain of muscle, fascia and tendon of lower back, initial encounter: Secondary | ICD-10-CM | POA: Diagnosis present

## 2023-01-14 ENCOUNTER — Ambulatory Visit: Payer: Self-pay | Admitting: Physician Assistant

## 2023-03-17 ENCOUNTER — Other Ambulatory Visit: Payer: Self-pay | Admitting: Internal Medicine

## 2023-03-17 DIAGNOSIS — Z1231 Encounter for screening mammogram for malignant neoplasm of breast: Secondary | ICD-10-CM
